# Patient Record
Sex: Male | Born: 1995
Health system: Southern US, Community
[De-identification: ages and names within clinical notes are randomized; demographics above are authoritative.]

## PROBLEM LIST (undated history)

## (undated) DIAGNOSIS — T7840XA Allergy, unspecified, initial encounter: Secondary | ICD-10-CM

## (undated) DIAGNOSIS — J45909 Unspecified asthma, uncomplicated: Secondary | ICD-10-CM

## (undated) HISTORY — DX: Unspecified asthma, uncomplicated: J45.909

## (undated) HISTORY — DX: Allergy, unspecified, initial encounter: T78.40XA

---
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
## 2020-04-17 ENCOUNTER — Telehealth: Payer: BLUE CROSS/BLUE SHIELD | Admitting: Physician Assistant

## 2020-04-17 DIAGNOSIS — R05 Cough: Secondary | ICD-10-CM

## 2020-04-17 DIAGNOSIS — R059 Cough, unspecified: Secondary | ICD-10-CM

## 2020-04-17 MED ORDER — BENZONATATE 100 MG PO CAPS: 100.0000 mg | ORAL_CAPSULE | Freq: Three times a day (TID) | ORAL | 0 refills | Status: AC

## 2020-04-17 MED ORDER — FLUTICASONE PROPIONATE 50 MCG/ACT NA SUSP: 2.0000 | Freq: Every day | NASAL | 0 refills | Status: AC

## 2020-04-17 MED ORDER — CHLORASEPTIC 1.4 % MT LIQD: 1.0000 | OROMUCOSAL | 0 refills | Status: AC | PRN

## 2020-04-17 NOTE — Progress Notes (Signed)
We are sorry that you are not feeling well.  Here is how we plan to help!  Based on your presentation I believe you most likely have A cough due to a virus.  This is called viral bronchitis and is best treated by rest, plenty of fluids and control of the cough.  You may use Ibuprofen or Tylenol as directed to help your symptoms.     In addition you may use A prescription cough medication called Tessalon Perles 100mg . You may take 1-2 capsules every 8 hours as needed for your cough. I have also prescribed fluticasone nasal spray and chloraseptic throat spray.   From your responses in the eVisit questionnaire you describe inflammation in the upper respiratory tract which is causing a significant cough.  This is commonly called Bronchitis and has four common causes:    Allergies  Viral Infections  Acid Reflux  Bacterial Infection Allergies, viruses and acid reflux are treated by controlling symptoms or eliminating the cause. An example might be a cough caused by taking certain blood pressure medications. You stop the cough by changing the medication. Another example might be a cough caused by acid reflux. Controlling the reflux helps control the cough.  USE OF BRONCHODILATOR ("RESCUE") INHALERS: There is a risk from using your bronchodilator too frequently.  The risk is that over-reliance on a medication which only relaxes the muscles surrounding the breathing tubes can reduce the effectiveness of medications prescribed to reduce swelling and congestion of the tubes themselves.  Although you feel brief relief from the bronchodilator inhaler, your asthma may actually be worsening with the tubes becoming more swollen and filled with mucus.  This can delay other crucial treatments, such as oral steroid medications. If you need to use a bronchodilator inhaler daily, several times per day, you should discuss this with your provider.  There are probably better treatments that could be used to keep your  asthma under control.     HOME CARE . Only take medications as instructed by your medical team. . Complete the entire course of an antibiotic. . Drink plenty of fluids and get plenty of rest. . Avoid close contacts especially the very young and the elderly . Cover your mouth if you cough or cough into your sleeve. . Always remember to wash your hands . A steam or ultrasonic humidifier can help congestion.   GET HELP RIGHT AWAY IF: . You develop worsening fever. . You become short of breath . You cough up blood. . Your symptoms persist after you have completed your treatment plan MAKE SURE YOU   Understand these instructions.  Will watch your condition.  Will get help right away if you are not doing well or get worse.  Your e-visit answers were reviewed by a board certified advanced clinical practitioner to complete your personal care plan.  Depending on the condition, your plan could have included both over the counter or prescription medications. If there is a problem please reply  once you have received a response from your provider. Your safety is important to .  If you have drug allergies check your prescription carefully.    You can use MyChart to ask questions about today's visit, request a non-urgent call back, or ask for a work or school excuse for 24 hours related to this e-Visit. If it has been greater than 24 hours you will need to follow up with your provider, or enter a new e-Visit to address those concerns. You will get an e-mail  in the next two days asking about your experience.  I hope that your e-visit has been valuable and will speed your recovery. Thank you for using e-visits.  Approximately 5 minutes was spent documenting and reviewing patient's chart.
